# Patient Record
Sex: Male | Born: 1990 | Race: Black or African American | Hispanic: No | Marital: Single
Health system: Southern US, Community
[De-identification: ages and names within clinical notes are randomized; demographics above are authoritative.]

---

## 2019-02-02 ENCOUNTER — Encounter (HOSPITAL_COMMUNITY): Payer: Self-pay | Admitting: Emergency Medicine

## 2019-02-02 ENCOUNTER — Emergency Department (HOSPITAL_COMMUNITY): Payer: Self-pay

## 2019-02-02 ENCOUNTER — Other Ambulatory Visit: Payer: Self-pay

## 2019-02-02 ENCOUNTER — Emergency Department (HOSPITAL_COMMUNITY)
Admission: EM | Admit: 2019-02-02 | Discharge: 2019-02-02 | Disposition: A | Discharge status: Home / Self Care | Payer: Self-pay | Attending: Emergency Medicine | Admitting: Emergency Medicine

## 2019-02-02 DIAGNOSIS — S62665A Nondisplaced fracture of distal phalanx of left ring finger, initial encounter for closed fracture: Secondary | ICD-10-CM | POA: Insufficient documentation

## 2019-02-02 DIAGNOSIS — W228XXA Striking against or struck by other objects, initial encounter: Secondary | ICD-10-CM | POA: Insufficient documentation

## 2019-02-02 DIAGNOSIS — Y9289 Other specified places as the place of occurrence of the external cause: Secondary | ICD-10-CM | POA: Insufficient documentation

## 2019-02-02 DIAGNOSIS — S61215A Laceration without foreign body of left ring finger without damage to nail, initial encounter: Secondary | ICD-10-CM | POA: Insufficient documentation

## 2019-02-02 DIAGNOSIS — Y9389 Activity, other specified: Secondary | ICD-10-CM | POA: Insufficient documentation

## 2019-02-02 DIAGNOSIS — Y990 Civilian activity done for income or pay: Secondary | ICD-10-CM | POA: Insufficient documentation

## 2019-02-02 NOTE — ED Triage Notes (Signed)
Pt reports that he hit his left finger on conveyer belt and this morning having pain with movement.

## 2019-02-02 NOTE — ED Notes (Signed)
Bed: WTR6 Expected date:  Expected time:  Means of arrival:  Comments: 

## 2019-02-02 NOTE — ED Provider Notes (Signed)
[facility]-EMERGENCY DEPT Provider Note   CSN: [id number] Arrival date & time: 02/02/19  1241    History   Chief Complaint Chief Complaint  Patient presents with  . Finger Injury    HPI Marvin Peters is a 28 y.o. male presenting today for injury of the left fourth finger.  Patient reports that he was at work last night around 2 AM when he struck his finger on a conveyor belt sustaining a laceration to the radial dorsal side of his left fourth finger, he washed and bandaged his finger shortly after.  Patient reports that approximately 2 hours prior to arrival he again struck the same finger on the conveyor belt this time with increased pain and swelling for which she presents the ER today.  Endorses a mild intensity constant throbbing sensation worsened with movement and palpation it improved following Tylenol.  Denies any other injuries or concerns.     HPI  History reviewed. No pertinent past medical history.  There are no active problems to display for this patient.   History reviewed. No pertinent surgical history.      Home Medications    Prior to Admission medications   Not on File    Family History No family history on file.  Social History Social History   Tobacco Use  . Smoking status: Not on file  Substance Use Topics  . Alcohol use: Not on file  . Drug use: Not on file     Allergies   Patient has no known allergies.   Review of Systems Review of Systems  Constitutional: Negative.  Negative for chills and fever.  Musculoskeletal: Positive for arthralgias (Left fourth PIP) and joint swelling (Left fourth PIP).  Skin: Positive for wound (Left fourth PIP).  Neurological: Negative.  Negative for weakness and numbness.   Physical Exam Updated Vital Signs BP 137/80 (BP Location: Right Arm)   Pulse 85   Temp 98.4 F (36.9 C) (Oral)   Resp 15   SpO2 100%   Physical Exam Constitutional:      General: He is not in acute  distress.    Appearance: Normal appearance. He is well-developed. He is not ill-appearing or diaphoretic.  HENT:     Head: Normocephalic and atraumatic.     Right Ear: External ear normal.     Left Ear: External ear normal.     Nose: Nose normal.  Eyes:     General: Vision grossly intact. Gaze aligned appropriately.     Pupils: Pupils are equal, round, and reactive to light.  Neck:     Musculoskeletal: Normal range of motion.     Trachea: Trachea and phonation normal. No tracheal deviation.  Cardiovascular:     Rate and Rhythm: Normal rate and regular rhythm.     Pulses: Normal pulses.          Radial pulses are 2+ on the right side and 2+ on the left side.  Pulmonary:     Effort: Pulmonary effort is normal. No respiratory distress.  Abdominal:     General: There is no distension.     Palpations: Abdomen is soft.     Tenderness: There is no abdominal tenderness. There is no guarding or rebound.  Musculoskeletal: Normal range of motion.     Right shoulder: Normal.     Right elbow: Normal.    Right wrist: Normal.     Right forearm: Normal.     Right hand: He exhibits tenderness and laceration. He  exhibits normal range of motion and normal capillary refill. Normal sensation noted. Normal strength noted.       Hands:     Comments: Left hand: 2 cm in length less than 2 mm in depth superficial laceration overlying the dorsal left fourth finger PIP.  Appears to be healing, granulation tissue present.  No signs of infection.  Remainder fingers appear intact.  Some tenderness to palpation over laceration. No snuffbox tenderness to palpation. No tenderness to palpation over flexor sheath.  Finger adduction/abduction intact with 5/5 strength.  Thumb opposition intact. Full active and resisted ROM to flexion/extension at wrist, MCP, PIP and DIP of all fingers.  FDS/FDP intact. Grip 5/5 strength.  Radial artery 2+ with <2sec cap refill in all fingers.  Sensation intact to light-tough in  median/ulnar/radial distributions.  Compartments soft.  Skin:    General: Skin is warm and dry.  Neurological:     Mental Status: He is alert.     GCS: GCS eye subscore is 4. GCS verbal subscore is 5. GCS motor subscore is 6.     Comments: Speech is clear and goal oriented, follows commands Major Cranial nerves without deficit, no facial droop Moves extremities without ataxia, coordination intact  Psychiatric:        Behavior: Behavior normal.    ED Treatments / Results  Labs (all labs ordered are listed, but only abnormal results are displayed) Labs Reviewed - No data to display  EKG None  Radiology Dg Hand Complete Left  Result Date: 02/02/2019 CLINICAL DATA:  Blunt trauma to the fourth digit with pain, initial encounter EXAM: LEFT HAND - COMPLETE 3+ VIEW COMPARISON:  None. FINDINGS: No significant soft tissue swelling is noted. There is a mildly displaced fracture through the distal phalangeal tuft of the fourth finger. Irregularity of the distal phalangeal tuft of the third digit is noted as well although appears to be related to prior injury with healing. No other focal abnormality is seen. IMPRESSION: Fourth distal phalangeal fracture. Changes of prior third distal phalangeal fracture with healing. Electronically Signed   By: Alcide CleverMark  Lukens M.D.   On: 02/02/2019 13:56    Procedures Procedures (including critical care time)  Medications Ordered in ED Medications - No data to display   Initial Impression / Assessment and Plan / ED Course  I have reviewed the triage vital signs and the nursing notes.  Pertinent labs & imaging results that were available during my care of the patient were reviewed by me and considered in my medical decision making (see chart for details).    Marvin LatinaDuncan Belland is a 28 y.o. male who presents to ED for laceration and pain of the left fourth finger that occurred approximately 12 hours ago.  Superficial laceration without evidence of infection.   Wound appears to be healing by secondary intention.  Patient seen and evaluated by Dr. Pilar PlateBero, wound closure is contraindicated at this time, will advise patient on wound care, dressing change here in the ED and encouraged continuation of topical antibiotics.  Wound is superficial less than 2 cm without evidence of foreign body or infection, no indication for systemic antibiotics at this time.  Patient reports that Tdap was updated last year at his job.  DG Left hand: IMPRESSION: Fourth distal phalangeal fracture. Changes of prior third distal phalangeal fracture with healing.   Patient given finger splint by nursing staff.  Referred to hand surgery for follow-up regarding his fracture.  Discussed rice protocol and work note given.  He  is neurovascular intact to left upper extremity, capillary refill and sensation intact to all 5 fingers full range motion and appropriate strength.  Patient counseled on home wound care. Patient was urged to return to the Emergency Department for worsening pain, swelling, expanding erythema especially if it streaks away from the affected area, fever, or for any additional concerns.  At this time there does not appear to be any evidence of an acute emergency medical condition and the patient appears stable for discharge with appropriate outpatient follow up. Diagnosis was discussed with patient who verbalizes understanding of care plan and is agreeable to discharge. I have discussed return precautions with patientwho verbalizes understanding of return precautions. Patient encouraged to follow-up with their PCP and hand. All questions answered. Patient has been discharged in good condition.  Patient's case rediscussed with Dr. Sedonia Small who agrees with plan to discharge with outpatient follow-up.   Note: Portions of this report may have been transcribed using voice recognition software. Every effort was made to ensure accuracy; however, inadvertent computerized transcription errors  may still be present. Final Clinical Impressions(s) / ED Diagnoses   Final diagnoses:  Laceration of left ring finger without foreign body without damage to nail, initial encounter  Closed nondisplaced fracture of distal phalanx of left ring finger, initial encounter    ED Discharge Orders    None       Gari Crown 02/02/19 1508    Maudie Flakes, MD 02/05/19 1056

## 2019-02-02 NOTE — Discharge Instructions (Signed)
You have been diagnosed today with left fourth finger fracture and laceration of the left fourth finger.  At this time there does not appear to be the presence of an emergent medical condition, however there is always the potential for conditions to change. Please read and follow the below instructions.  Please return to the Emergency Department immediately for any new or worsening symptoms. Please be sure to follow up with your Primary Care Provider within one week regarding your visit today; please call their office to schedule an appointment even if you are feeling better for a follow-up visit. Please keep your wound clean, wash twice daily with soap and water keep covered with clean sterile bandage and use over-the-counter antibiotic ointment as instructed.  Monitor for signs of infection and return to the emergency department immediately if they occur.  Continue to use the finger splint given to you today to protect your finger fracture.  Please call the hand surgeon Dr. Shelbie Ammons office today to schedule a follow-up appointment to ensure proper healing of your fracture.  Get help right away if: You develop severe swelling around your wound. You have pus or a bad smell coming from your wound. Your pain is severe and suddenly gets worse. You develop painful lumps near your wound or anywhere on your body. You have a red streak going away from your wound. The wound is on your hand or foot, and: You cannot properly move a finger or toe. Your fingers or toes look pale or bluish. You have numbness spreading down your hand, foot, fingers, or toes. Get help right away if: Your finger gets numb or turns blue.  Please read the additional information packets attached to your discharge summary.  Do not take your medicine if  develop an itchy rash, swelling in your mouth or lips, or difficulty breathing; call 911 and seek immediate emergency medical attention if this occurs.

## 2020-02-23 IMAGING — CR LEFT HAND - COMPLETE 3+ VIEW
3 series · 3 of 3 positions shown · non-contrast
Comparison: None.

CLINICAL DATA: Blunt trauma to the fourth digit with pain, initial
encounter

EXAM:
LEFT HAND - COMPLETE 3+ VIEW

[x hand pa left]
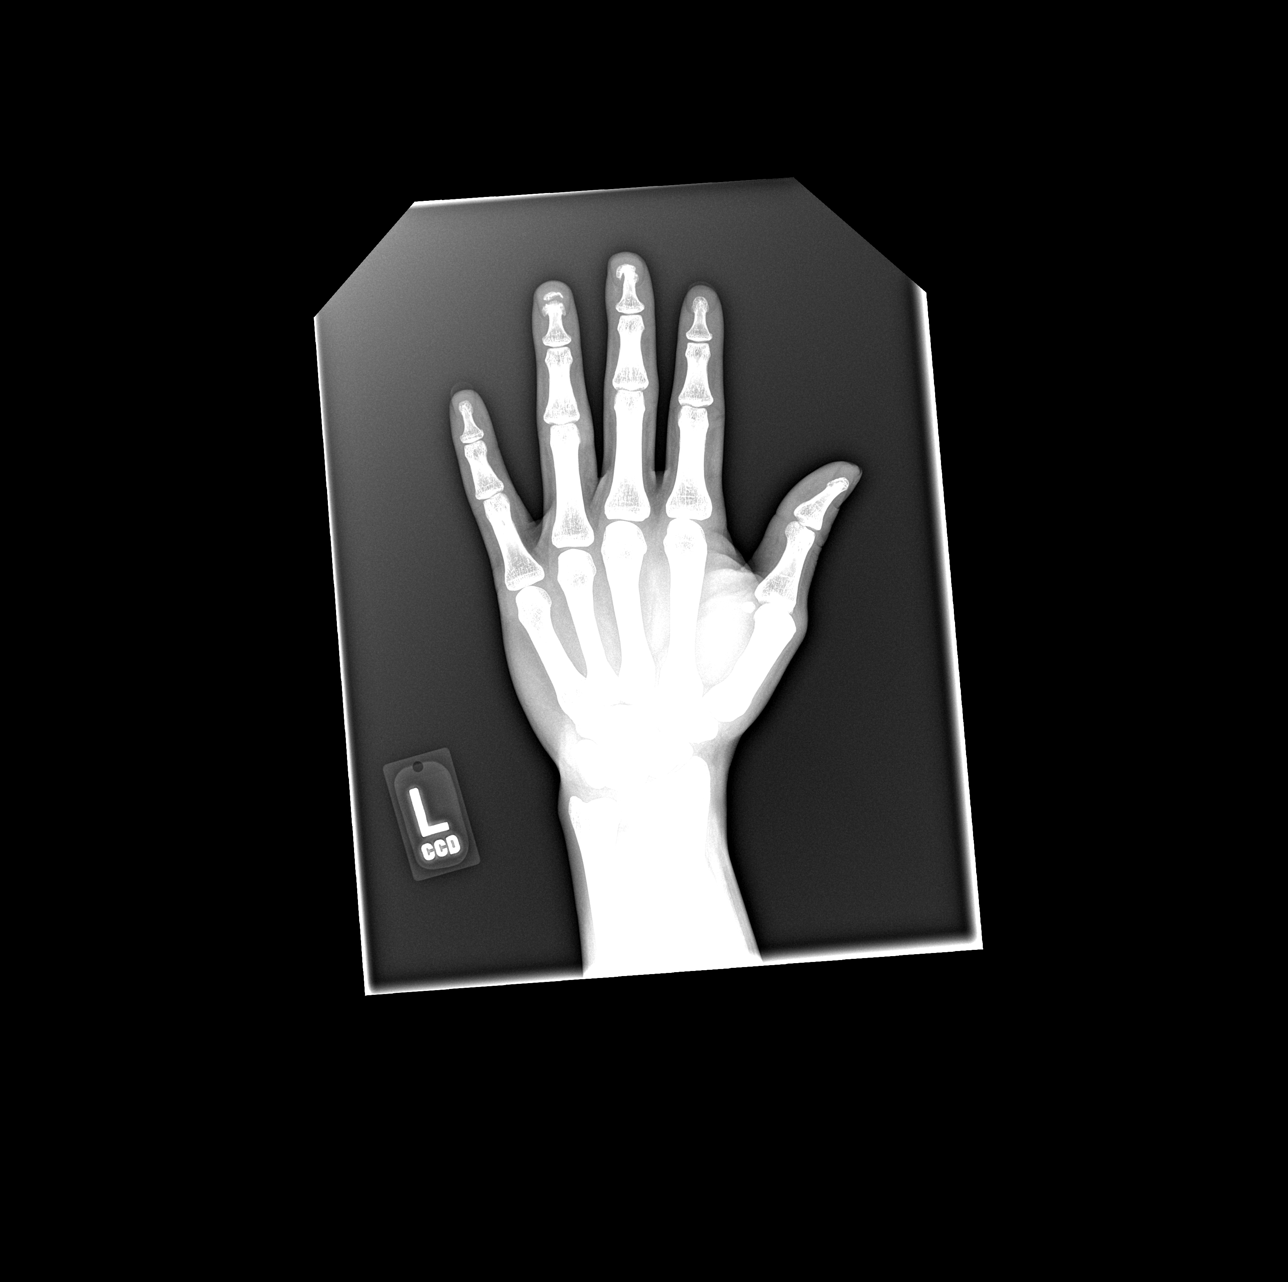

[x hand obl left]
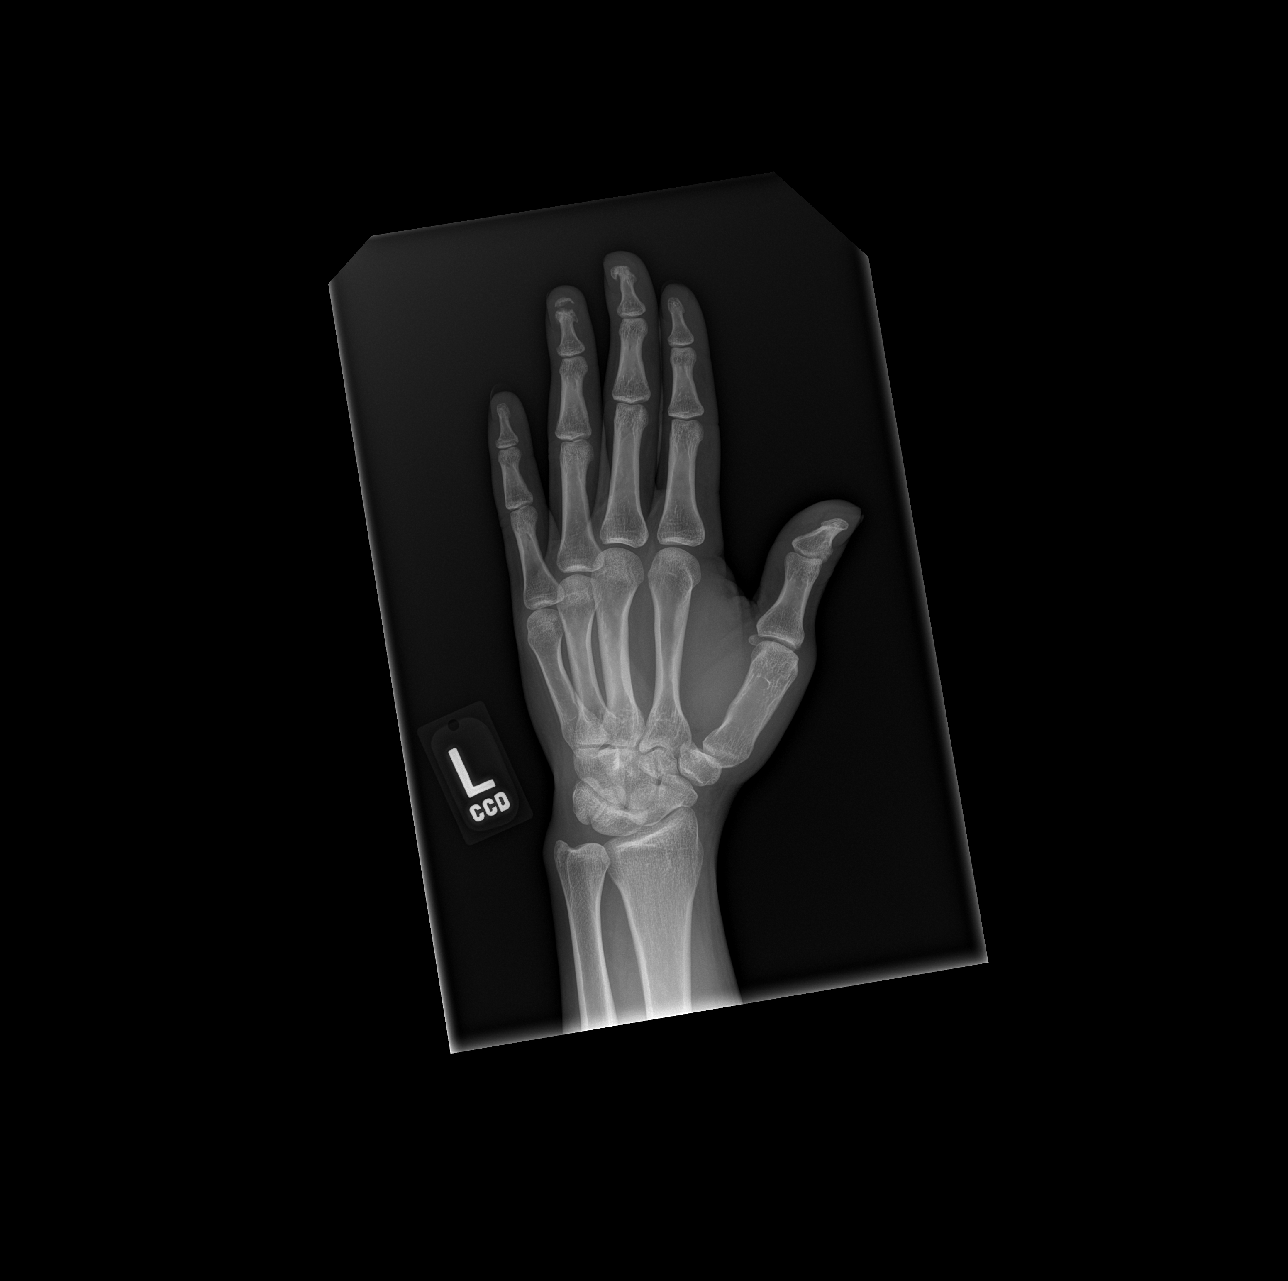

[x hand lat left]
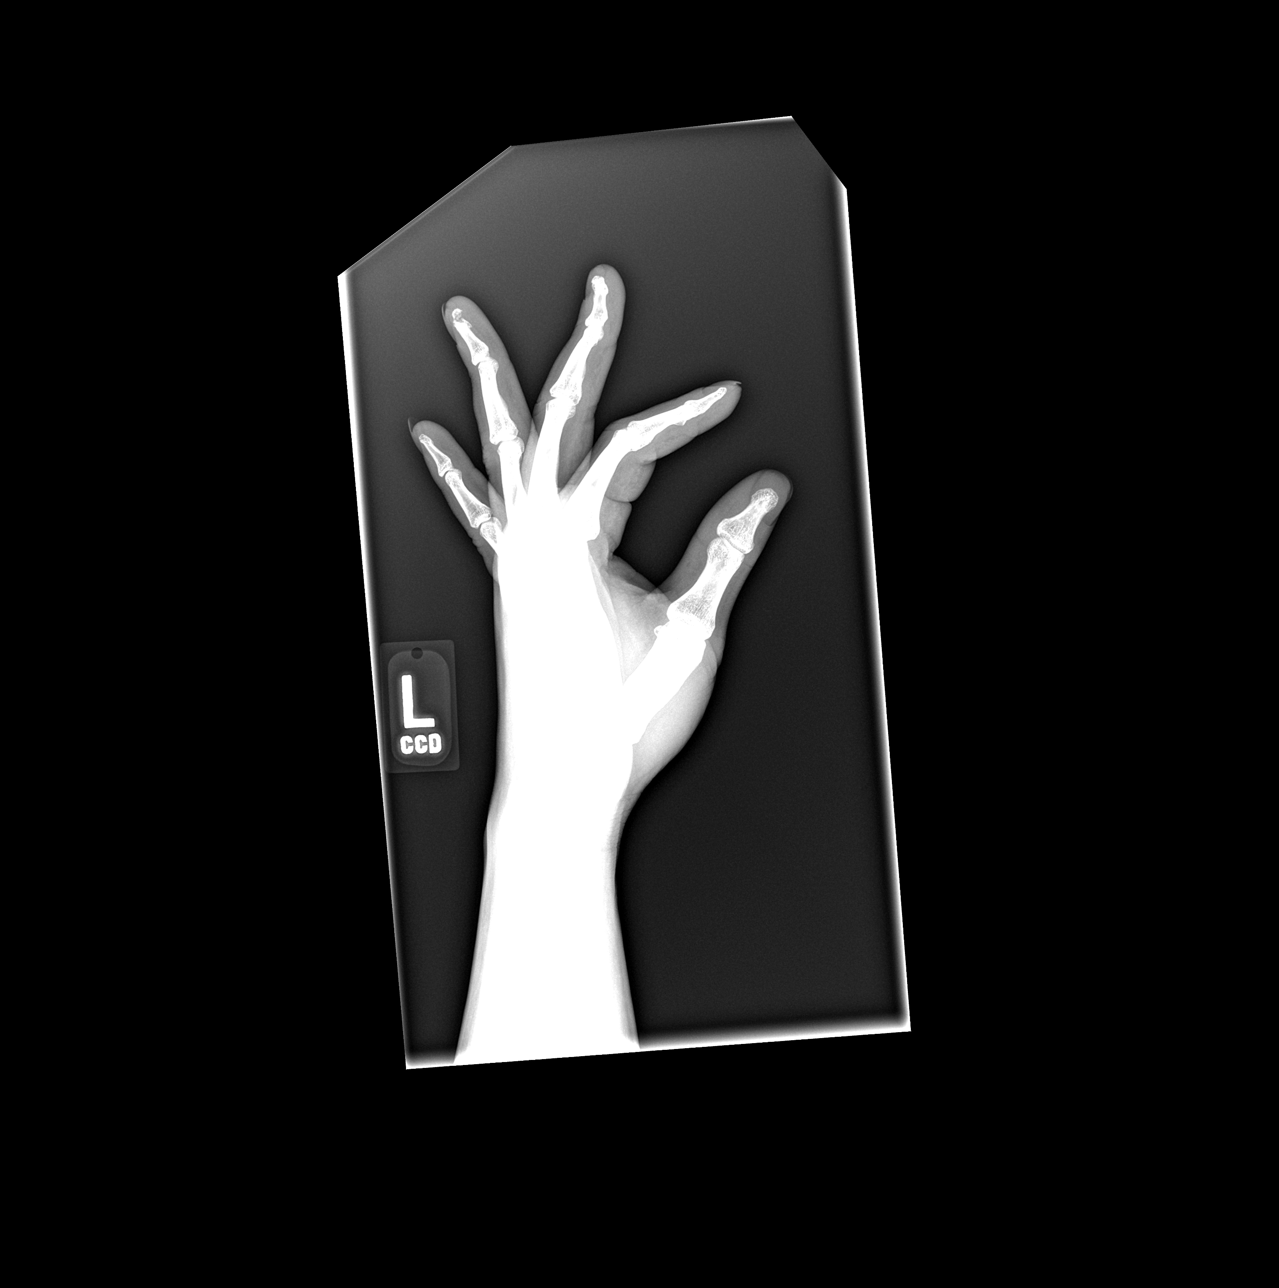

[3 of 3 positions shown; findings below may reference images not displayed]

FINDINGS: No significant soft tissue swelling is noted. There is a mildly
displaced fracture through the distal phalangeal tuft of the fourth
finger. Irregularity of the distal phalangeal tuft of the third
digit is noted as well although appears to be related to prior
injury with healing. No other focal abnormality is seen.
IMPRESSION: Fourth distal phalangeal fracture.

Changes of prior third distal phalangeal fracture with healing.
